# Patient Record
Sex: Female | Born: 1970 | Race: White | Hispanic: No | Marital: Married | Smoking: Never smoker
Health system: Southern US, Community
[De-identification: ages and names within clinical notes are randomized; demographics above are authoritative.]

## PROBLEM LIST (undated history)

## (undated) DIAGNOSIS — I341 Nonrheumatic mitral (valve) prolapse: Secondary | ICD-10-CM

## (undated) HISTORY — PX: TONSILLECTOMY: SUR1361

## (undated) HISTORY — PX: CYSTOSCOPY: SUR368

---

## 2017-09-04 ENCOUNTER — Emergency Department (INDEPENDENT_AMBULATORY_CARE_PROVIDER_SITE_OTHER): Payer: BLUE CROSS/BLUE SHIELD

## 2017-09-04 ENCOUNTER — Encounter: Payer: Self-pay | Admitting: Emergency Medicine

## 2017-09-04 ENCOUNTER — Emergency Department (INDEPENDENT_AMBULATORY_CARE_PROVIDER_SITE_OTHER)
Admission: EM | Admit: 2017-09-04 | Discharge: 2017-09-04 | Disposition: A | Discharge status: Home / Self Care | Payer: BLUE CROSS/BLUE SHIELD | Source: Home / Self Care | Attending: Family Medicine | Admitting: Family Medicine

## 2017-09-04 ENCOUNTER — Other Ambulatory Visit: Payer: Self-pay

## 2017-09-04 DIAGNOSIS — S7002XA Contusion of left hip, initial encounter: Secondary | ICD-10-CM

## 2017-09-04 DIAGNOSIS — M25552 Pain in left hip: Secondary | ICD-10-CM | POA: Diagnosis not present

## 2017-09-04 DIAGNOSIS — M25512 Pain in left shoulder: Secondary | ICD-10-CM | POA: Diagnosis not present

## 2017-09-04 DIAGNOSIS — S40012A Contusion of left shoulder, initial encounter: Secondary | ICD-10-CM

## 2017-09-04 DIAGNOSIS — S8002XA Contusion of left knee, initial encounter: Secondary | ICD-10-CM

## 2017-09-04 HISTORY — DX: Nonrheumatic mitral (valve) prolapse: I34.1

## 2017-09-04 NOTE — ED Provider Notes (Signed)
[facility]    CSN: [id number] Arrival date & time: 09/04/17  1057     History   Chief Complaint Chief Complaint  Patient presents with  . Shoulder Pain  . Hip Pain  . Knee Pain    HPI Latoya Phillips is a 47 y.o. female.   Patient reports that she slipped in a grocery store yesterday, landing on her left side.  She complains of soreness in her left shoulder and hip.  She initially had soreness in her left knee, now almost resolved.  The history is provided by the patient.  Shoulder Injury  This is a new problem. The current episode started yesterday. The problem occurs constantly. The problem has been gradually improving. Pertinent negatives include no chest pain and no abdominal pain. Exacerbated by: movement. Nothing relieves the symptoms. Treatments tried: ice packs. The treatment provided mild relief.  Hip Pain  This is a new problem. The current episode started yesterday. The problem occurs constantly. The problem has been gradually improving. Pertinent negatives include no chest pain and no abdominal pain. Exacerbated by: walking. Nothing relieves the symptoms. Treatments tried: ice pack. The treatment provided mild relief.    Past Medical History:  Diagnosis Date  . MVP (mitral valve prolapse)     There are no active problems to display for this patient.   Past Surgical History:  Procedure Laterality Date  . CYSTOSCOPY     age 17  . TONSILLECTOMY      OB History   None      Home Medications    Prior to Admission medications   Not on File    Family History No family history on file.  Social History Social History   Tobacco Use  . Smoking status: Never Smoker  . Smokeless tobacco: Never Used  Substance Use Topics  . Alcohol use: Yes  . Drug use: Not on file     Allergies   Patient has no allergy information on record.   Review of Systems Review of Systems  Cardiovascular: Negative for chest pain.  Gastrointestinal: Negative  for abdominal pain.  All other systems reviewed and are negative.    Physical Exam Triage Vital Signs ED Triage Vitals  Enc Vitals Group     BP 09/04/17 1123 121/79     Pulse Rate 09/04/17 1123 78     Resp 09/04/17 1123 16     Temp 09/04/17 1123 98.7 F (37.1 C)     Temp Source 09/04/17 1123 Oral     SpO2 09/04/17 1123 98 %     Weight 09/04/17 1124 145 lb (65.8 kg)     Height 09/04/17 1124 5\' 6"  (1.676 m)     Head Circumference --      Peak Flow --      Pain Score 09/04/17 1123 5     Pain Loc --      Pain Edu? --      Excl. in GC? --    No data found.  Updated Vital Signs BP 121/79 (BP Location: Right Arm)   Pulse 78   Temp 98.7 F (37.1 C) (Oral)   Resp 16   Ht 5\' 6"  (1.676 m)   Wt 145 lb (65.8 kg)   LMP 09/04/2017   SpO2 98%   BMI 23.40 kg/m   Visual Acuity Right Eye Distance:   Left Eye Distance:   Bilateral Distance:    Right Eye Near:   Left Eye Near:    Bilateral  Near:     Physical Exam  Constitutional: She appears well-developed and well-nourished. No distress.  HENT:  Head: Atraumatic.  Right Ear: External ear normal.  Left Ear: External ear normal.  Nose: Nose normal.  Mouth/Throat: Oropharynx is clear and moist.  Eyes: Pupils are equal, round, and reactive to light.  Neck: Normal range of motion.  Cardiovascular: Normal heart sounds.  Pulmonary/Chest: Breath sounds normal.  Abdominal: There is no tenderness.  Musculoskeletal:       Left shoulder: She exhibits tenderness. She exhibits normal range of motion, no bony tenderness, no swelling, no crepitus, no deformity, no laceration, normal pulse and normal strength.       Left hip: She exhibits tenderness. She exhibits normal range of motion, normal strength, no bony tenderness, no swelling and no crepitus.       Left knee: Normal.       Arms:      Legs: Left shoulder has mild tenderness to palpation as noted on diagram.  Shoulder has full range of motion.  Apley's and empty can test  negative.  Normal external rotation strength and range of motion.  Left hip has tenderness to palpation over the greater trochanter.  No hematoma present.    Neurological: She is alert.  Skin: Skin is warm and dry.  Nursing note and vitals reviewed.    UC Treatments / Results  Labs (all labs ordered are listed, but only abnormal results are displayed) Labs Reviewed - No data to display  EKG None  Radiology Dg Shoulder Left  Result Date: 09/04/2017 CLINICAL DATA:  Left shoulder pain following a fall yesterday. EXAM: LEFT SHOULDER - 2+ VIEW COMPARISON:  None. FINDINGS: Mild inferior AC joint spur formation.  No fracture or dislocation. IMPRESSION: No fracture or dislocation.  Mild AC joint degenerative changes. Electronically Signed   By: Beckie SaltsSteven  Reid M.D.   On: 09/04/2017 12:51   Dg Hip Unilat W Or Wo Pelvis 2-3 Views Left  Result Date: 09/04/2017 CLINICAL DATA:  Fall on left side.  Left hip pain EXAM: DG HIP (WITH OR WITHOUT PELVIS) 2-3V LEFT COMPARISON:  None. FINDINGS: There is no evidence of hip fracture or dislocation. There is no evidence of arthropathy or other focal bone abnormality. IMPRESSION: Negative. Electronically Signed   By: Charlett NoseKevin  Dover M.D.   On: 09/04/2017 12:50    Procedures Procedures (including critical care time)  Medications Ordered in UC Medications - No data to display  Initial Impression / Assessment and Plan / UC Course  I have reviewed the triage vital signs and the nursing notes.  Pertinent labs & imaging results that were available during my care of the patient were reviewed by me and considered in my medical decision making (see chart for details).    Followup with Dr. Rodney Langtonhomas Thekkekandam or Dr. Clementeen GrahamEvan Corey ([facility]) if not improving about two weeks.    Final Clinical Impressions(s) / UC Diagnoses   Final diagnoses:  Contusion of left shoulder, initial encounter  Contusion of left hip, initial encounter  Contusion of left  knee, initial encounter     Discharge Instructions     Apply ice pack for 20 to 30 minutes, 3 to 4 times daily  Continue until pain and swelling decrease.  May take Ibuprofen 200mg , 4 tabs every 8 hours with food.  Begin range of motion and stretching exercises as tolerated.    ED Prescriptions    None        Lattie HawBeese, Stephen A,  MD 09/07/17 1323

## 2017-09-04 NOTE — Discharge Instructions (Addendum)
Apply ice pack for 20 to 30 minutes, 3 to 4 times daily  Continue until pain and swelling decrease.  May take Ibuprofen 200mg, 4 tabs every 8 hours with food.  Begin range of motion and stretching exercises as tolerated. °

## 2017-09-04 NOTE — ED Triage Notes (Signed)
Slipped and fell at grocery store yesterday; left side of body had impact and her hip, shoulder and knee are sore; no OTC today.

## 2019-03-01 IMAGING — DX DG SHOULDER 2+V*L*
3 series · 3 of 3 positions shown · non-contrast
Comparison: None.

CLINICAL DATA: Left shoulder pain following a fall yesterday.

EXAM:
LEFT SHOULDER - 2+ VIEW

[shoulder grashey]
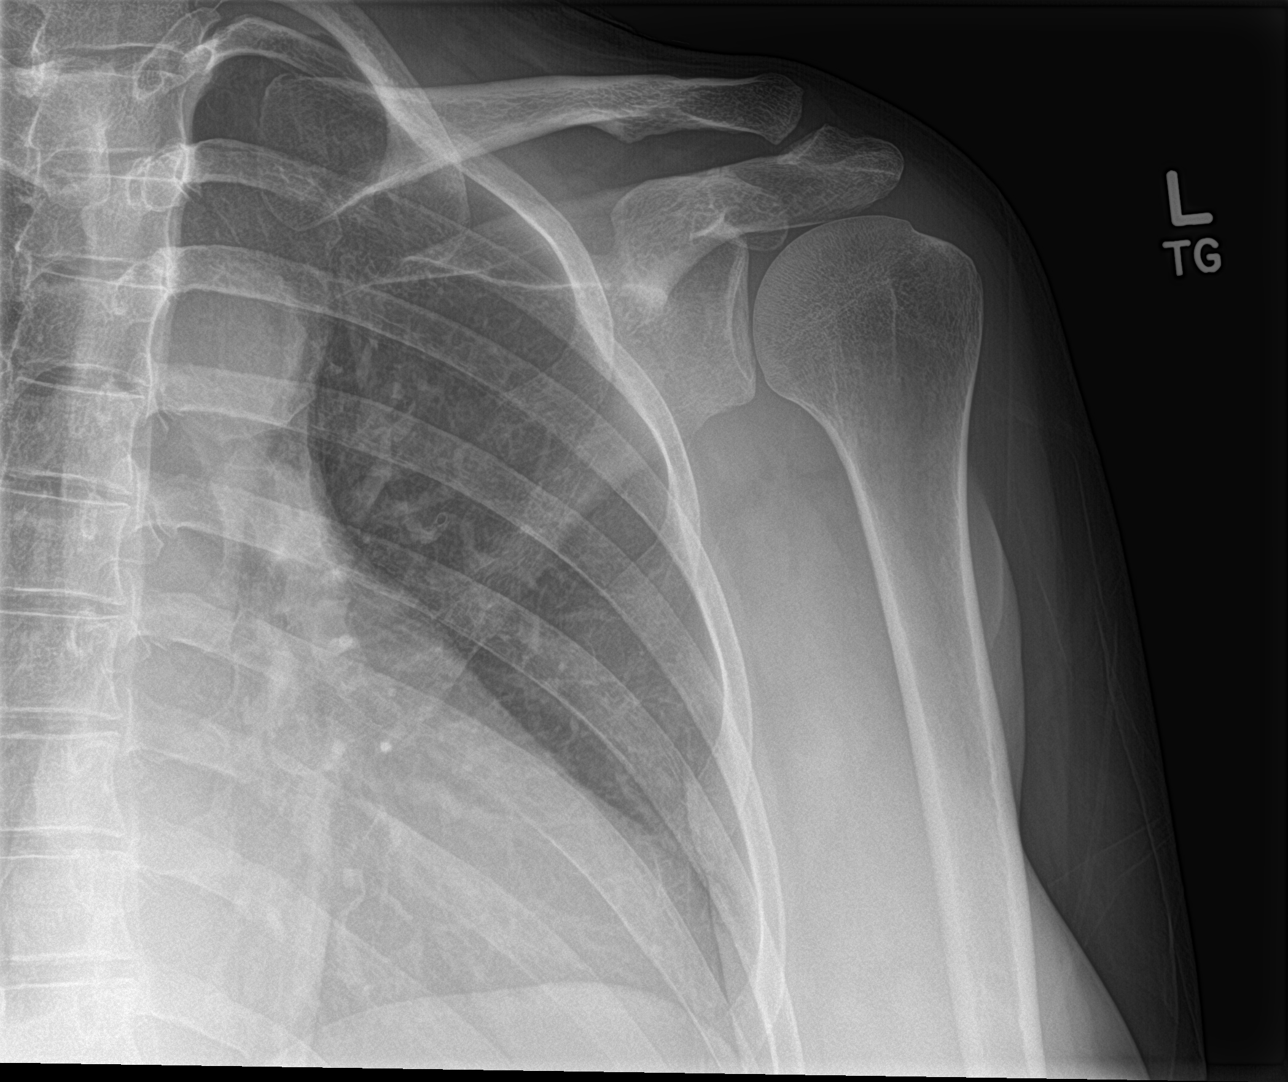

[shoulder y view]
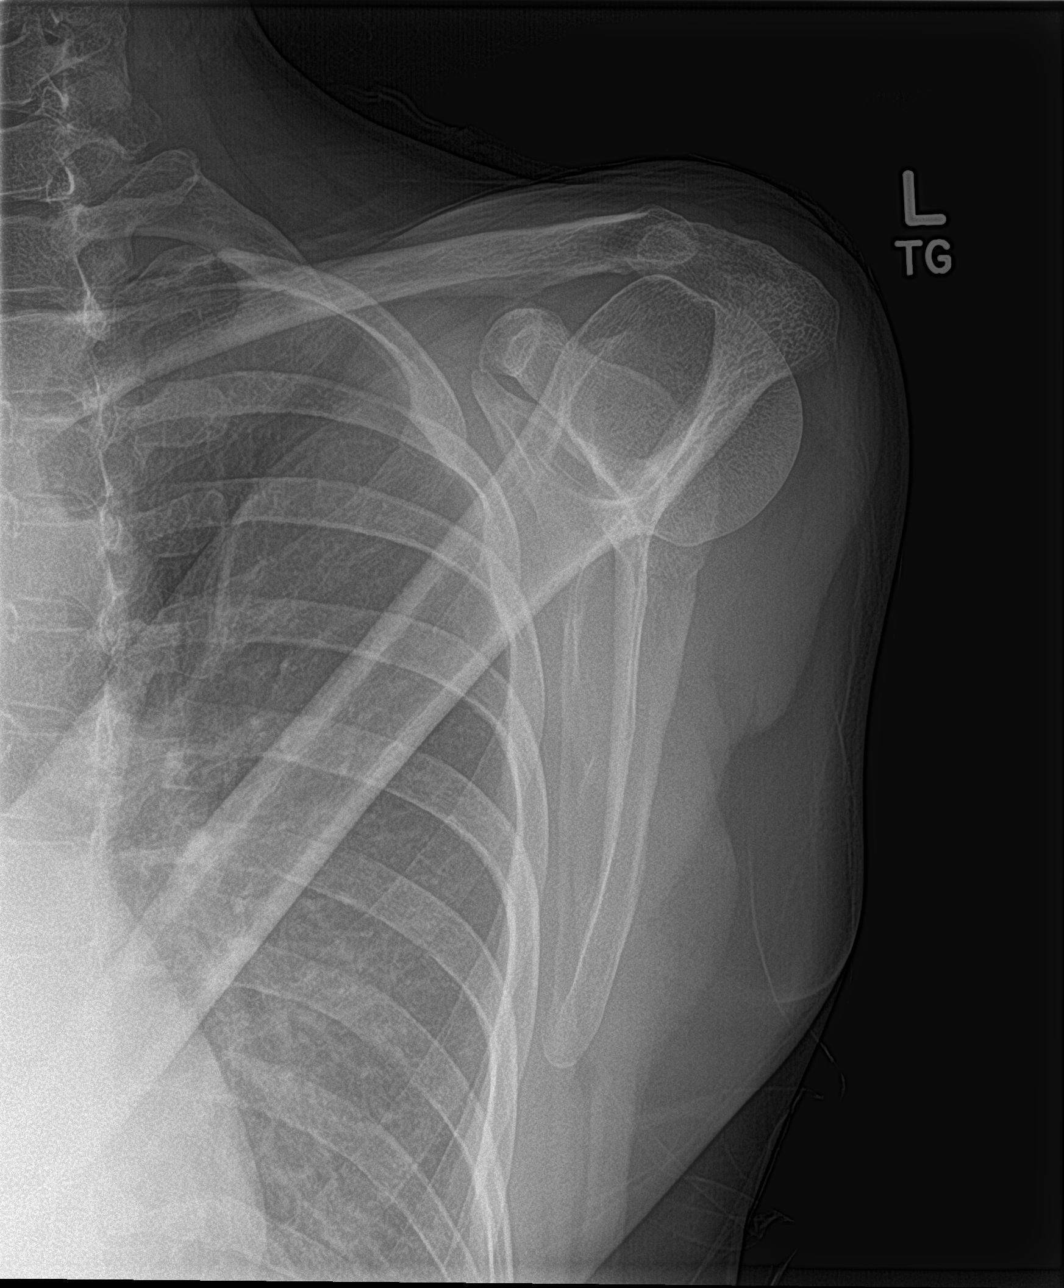

[shoulder axillary]
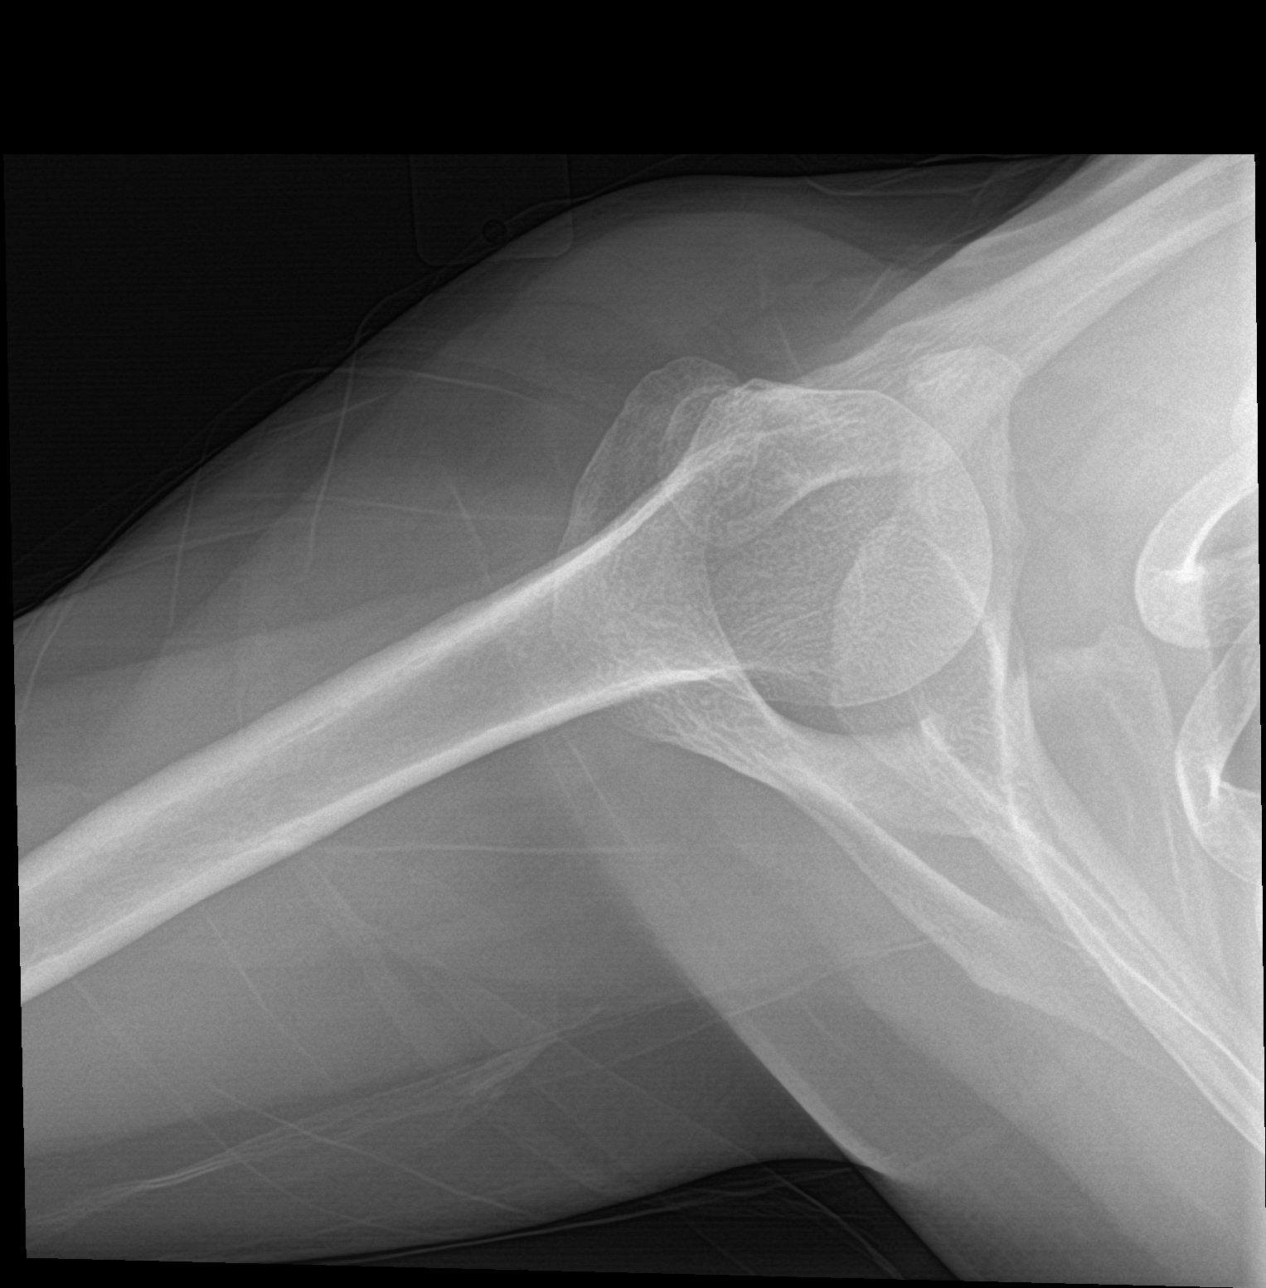

[3 of 3 positions shown; findings below may reference images not displayed]

FINDINGS: Mild inferior AC joint spur formation.  No fracture or dislocation.
IMPRESSION: No fracture or dislocation.  Mild AC joint degenerative changes.

## 2019-03-01 IMAGING — DX DG HIP (WITH OR WITHOUT PELVIS) 2-3V*L*
3 series · 3 of 3 positions shown · non-contrast
Comparison: None.

CLINICAL DATA: Fall on left side.  Left hip pain

EXAM:
DG HIP (WITH OR WITHOUT PELVIS) 2-3V LEFT

[pelvis ap]
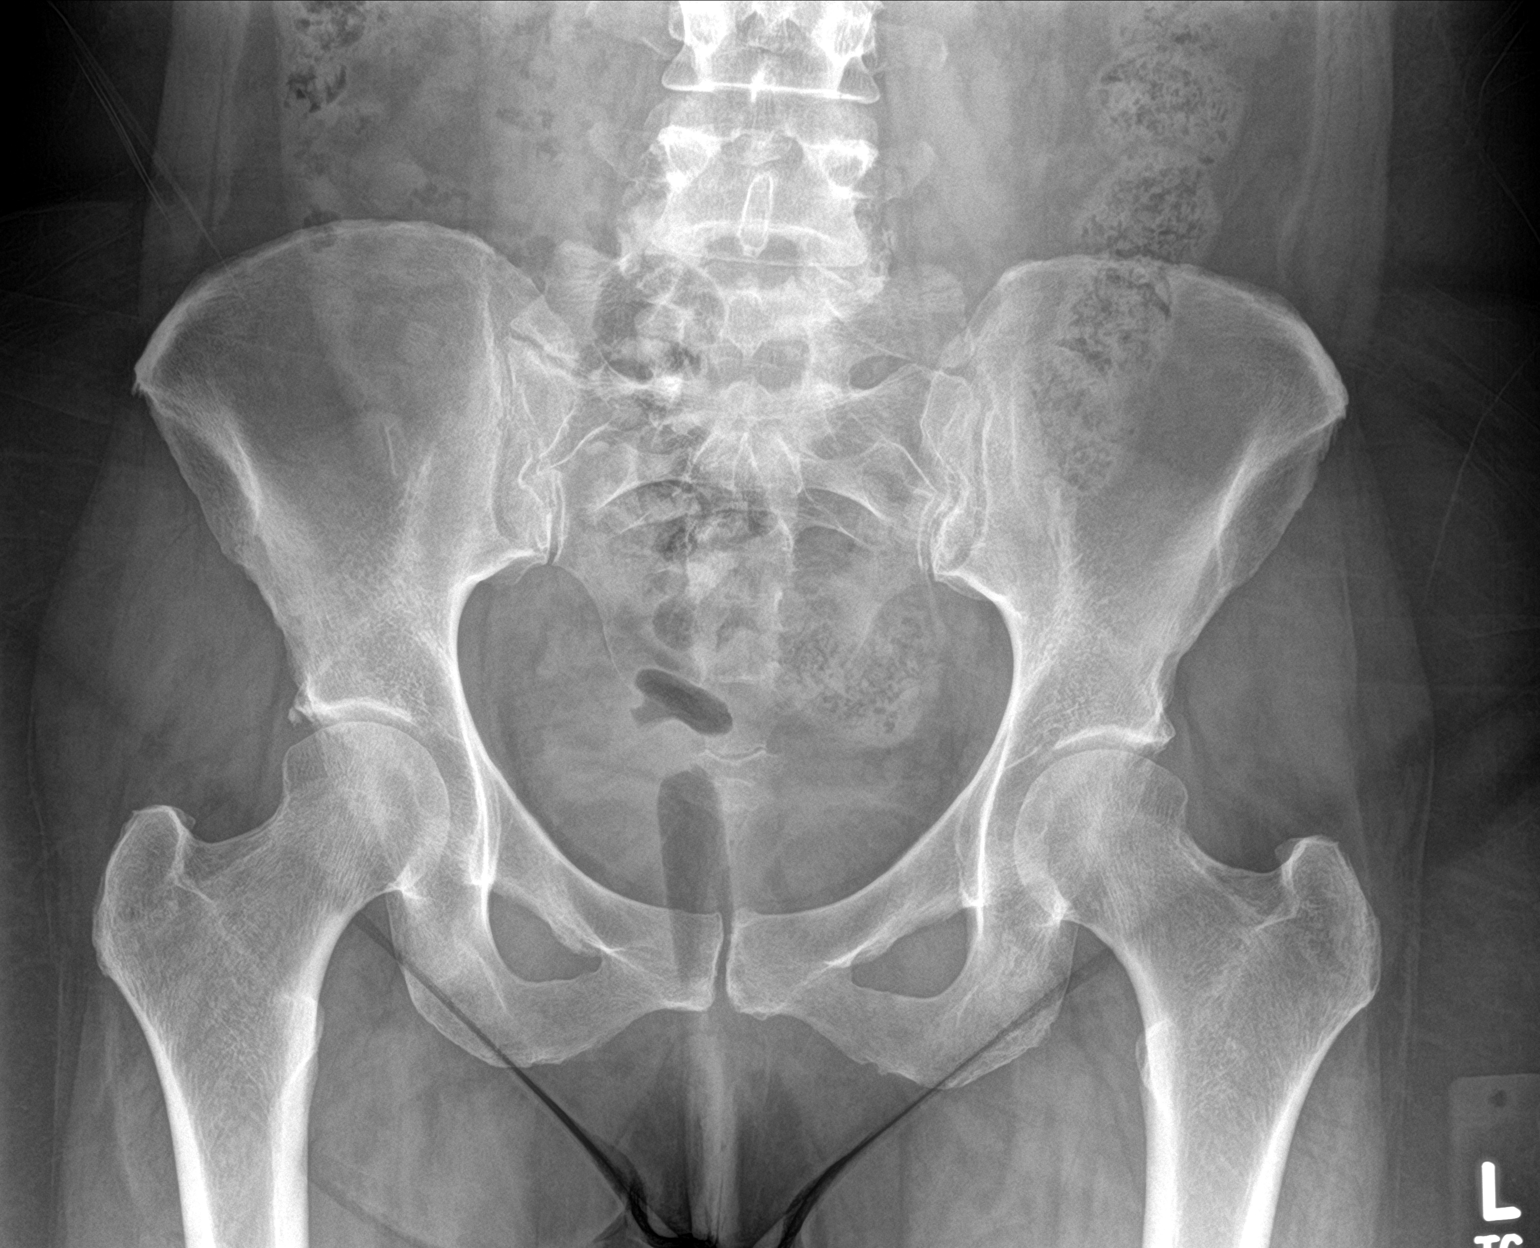

[hip ap]
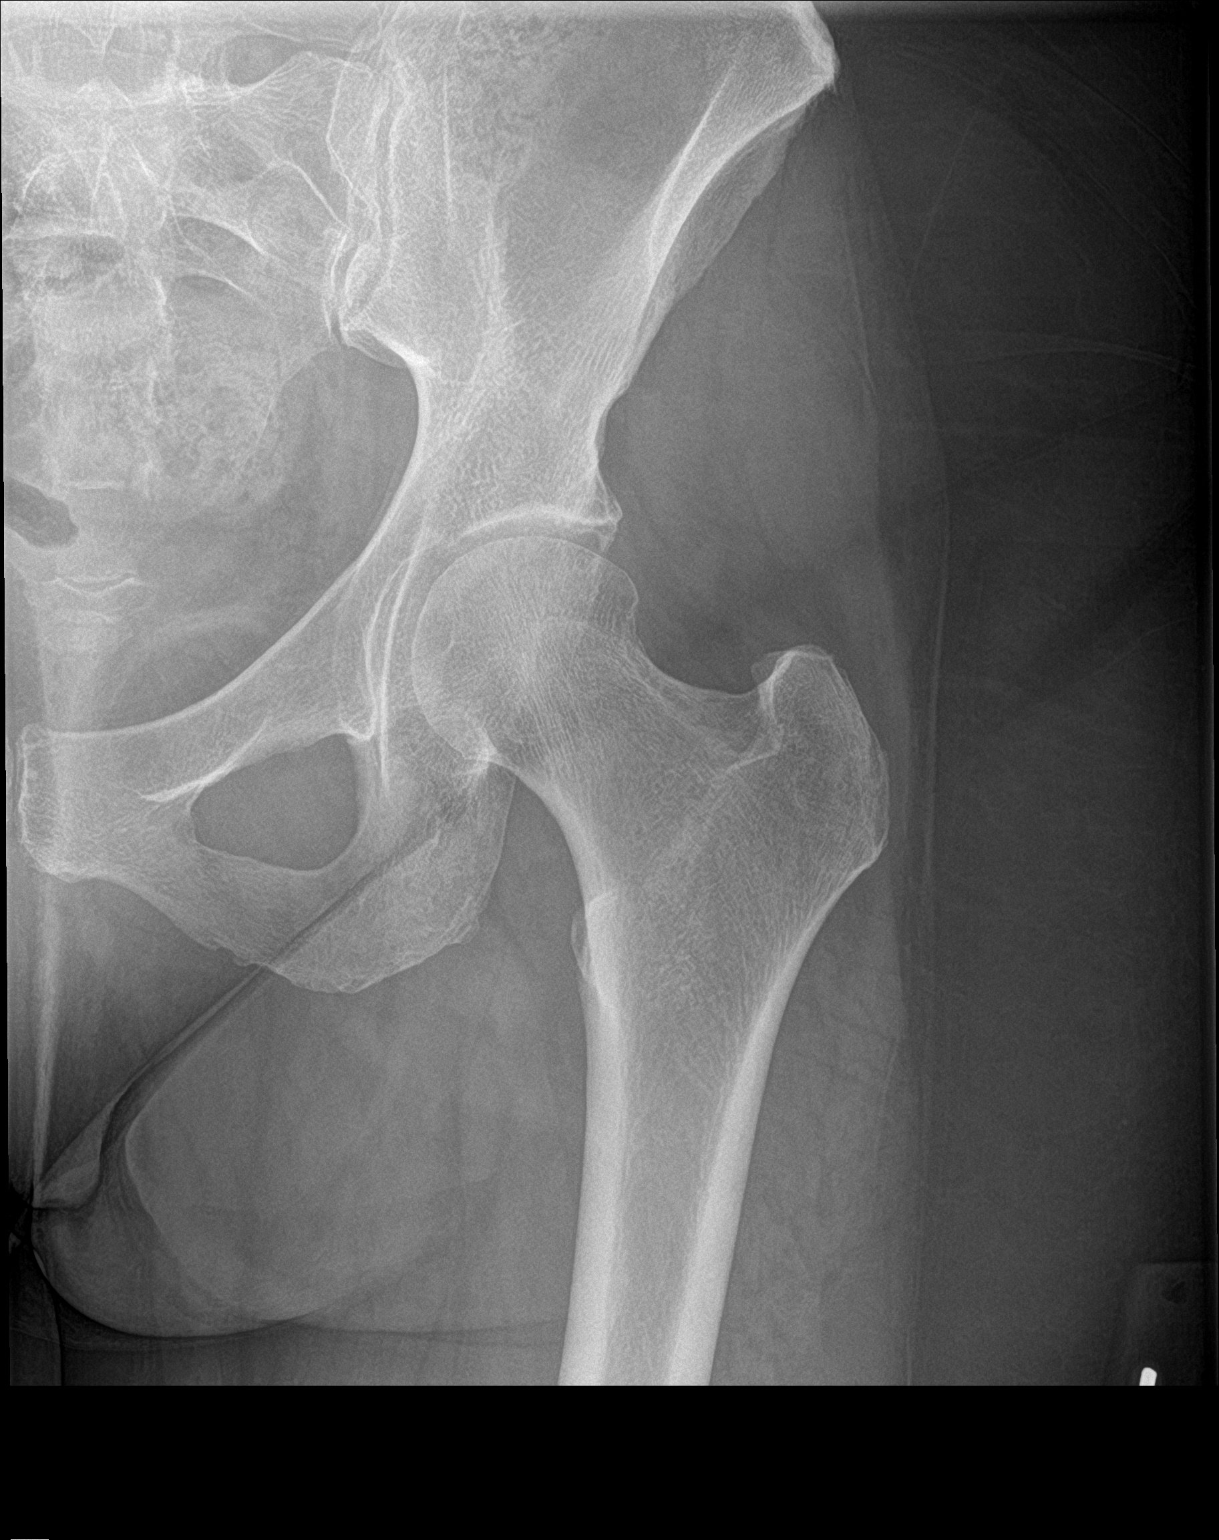

[hip lat]
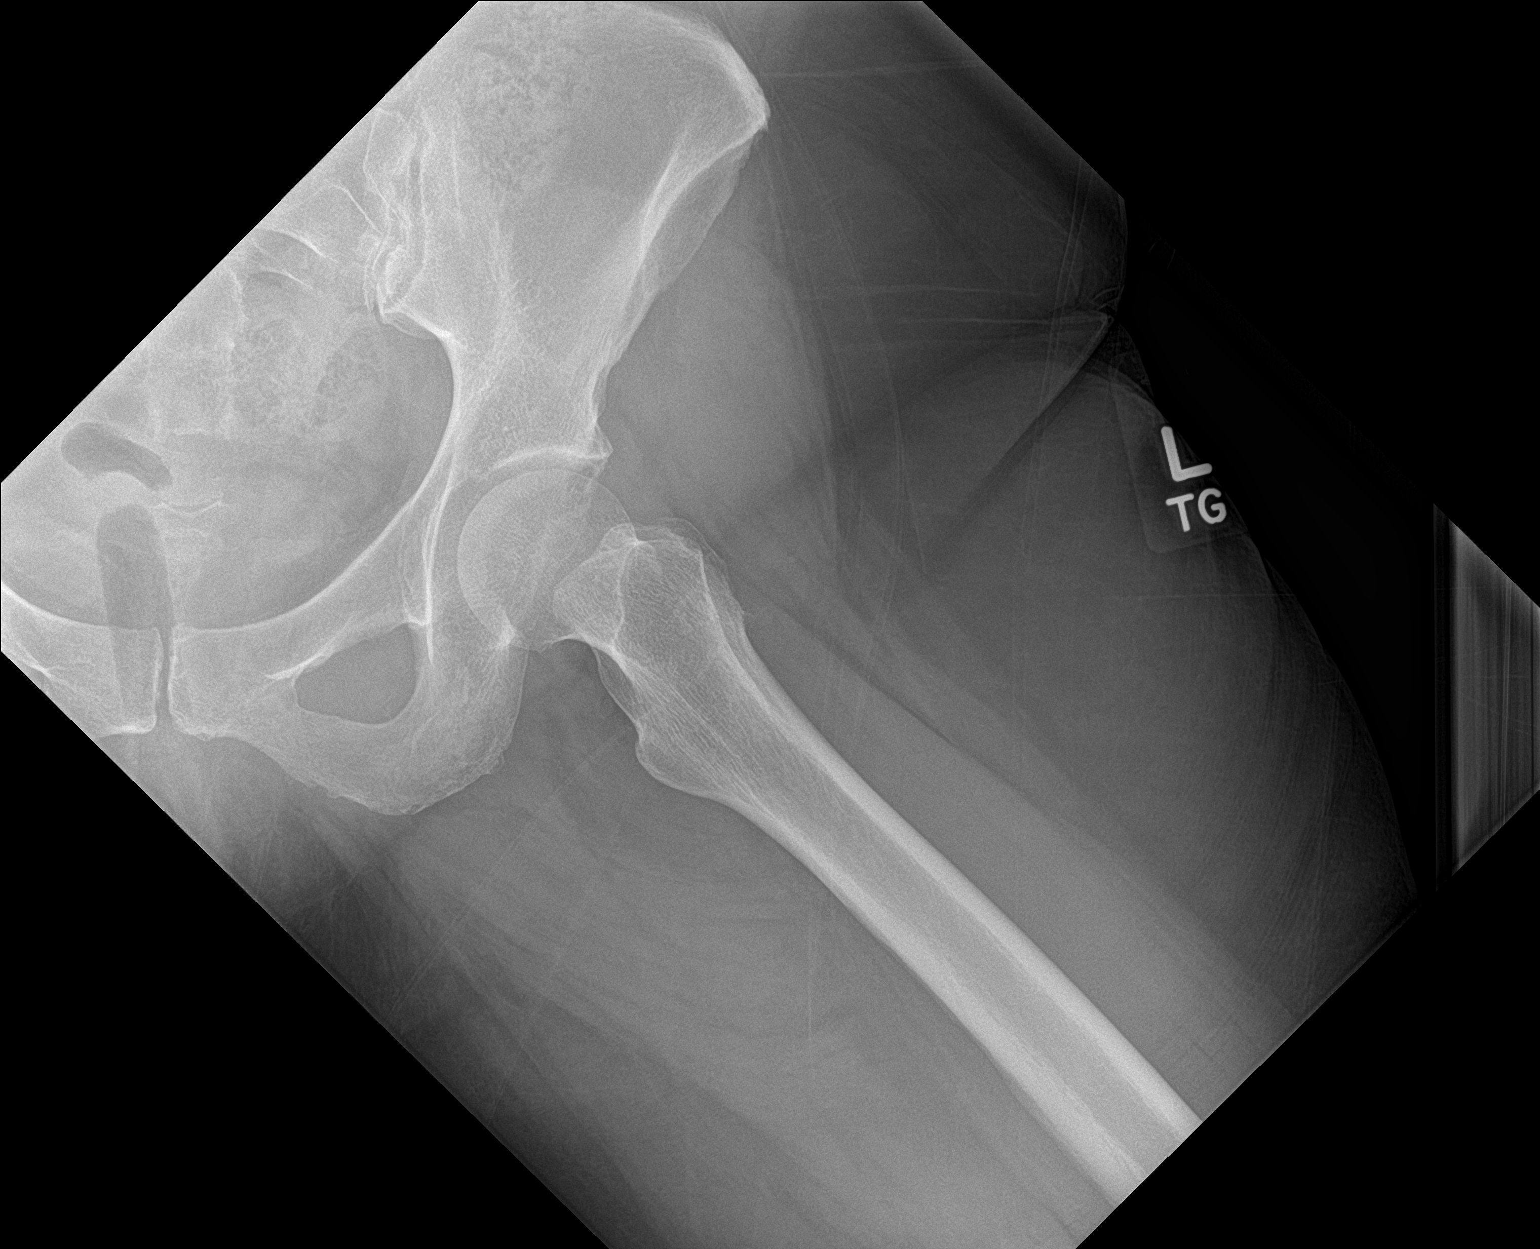

[3 of 3 positions shown; findings below may reference images not displayed]

FINDINGS: There is no evidence of hip fracture or dislocation. There is no
evidence of arthropathy or other focal bone abnormality.
IMPRESSION: Negative.
